# Patient Record
Sex: Female | Born: 1975
Health system: Southern US, Community
[De-identification: ages and names within clinical notes are randomized; demographics above are authoritative.]

---

## 2006-05-23 ENCOUNTER — Inpatient Hospital Stay: Payer: Self-pay

## 2009-05-19 ENCOUNTER — Emergency Department (HOSPITAL_COMMUNITY): Admission: EM | Admit: 2009-05-19 | Discharge: 2009-05-19 | Payer: Self-pay | Admitting: Emergency Medicine

## 2009-10-28 ENCOUNTER — Observation Stay: Payer: Self-pay | Admitting: Obstetrics and Gynecology

## 2009-11-26 ENCOUNTER — Inpatient Hospital Stay: Payer: Self-pay

## 2010-01-02 IMAGING — US US OB TRANSVAGINAL MODIFY
1 series · 14 of 28 positions shown · non-contrast
Comparison: none

CLINICAL DATA: Bleeding.  Last menstrual period 03/02/2009.
Estimated gestational age by last menstrual period 11 weeks 1 day.

OBSTETRIC <14 WK US AND TRANSVAGINAL OB US
TECHNIQUE: Both transabdominal and transvaginal ultrasound
examinations were performed for complete evaluation of the
gestation as well as the maternal uterus, adnexal regions, and
pelvic cul-de-sac.

[Series 1: us ob transvaginal modify · 0.25mm/px · 44 acquisitions, 14 frames shown]
[im 2/44]
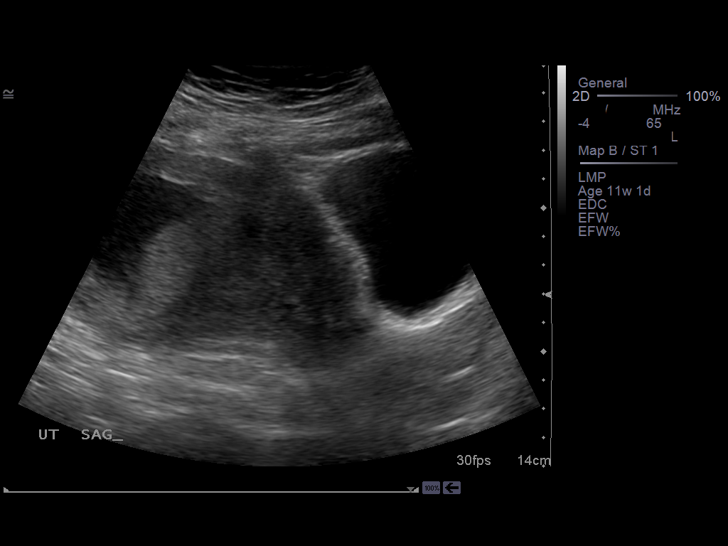
[im 5/44]
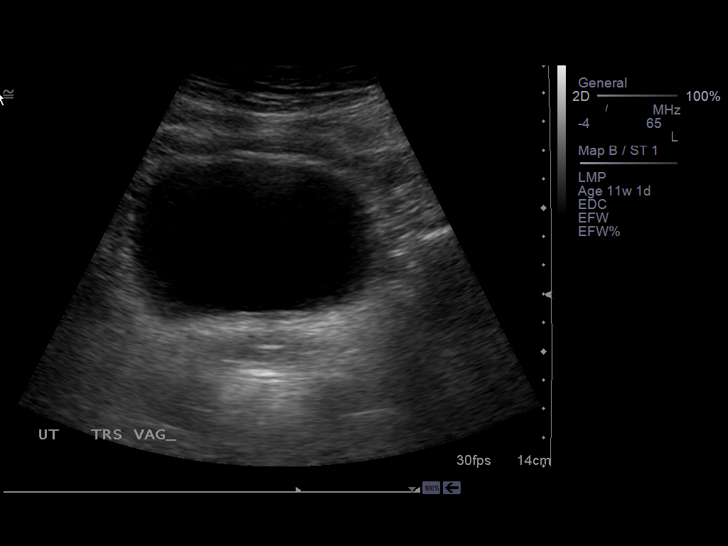
[im 8/44]
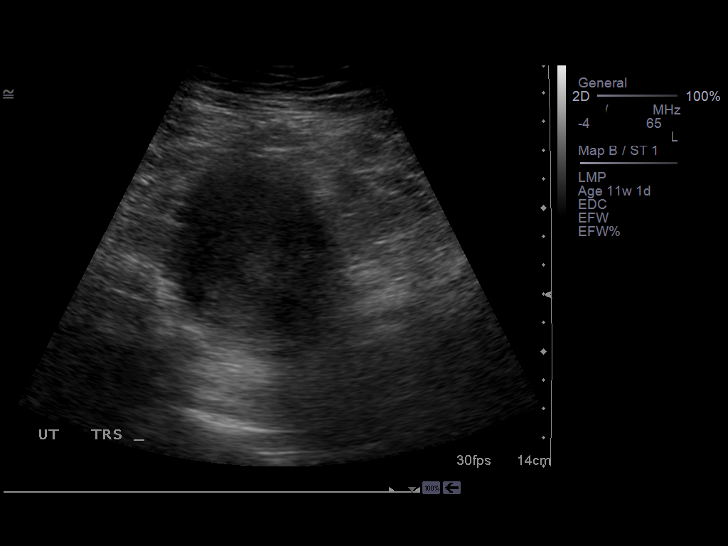
[im 12/44]
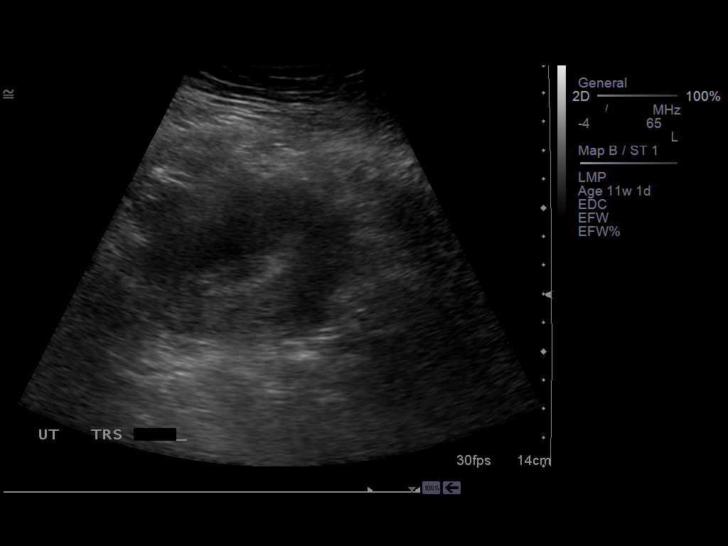
[im 15/44]
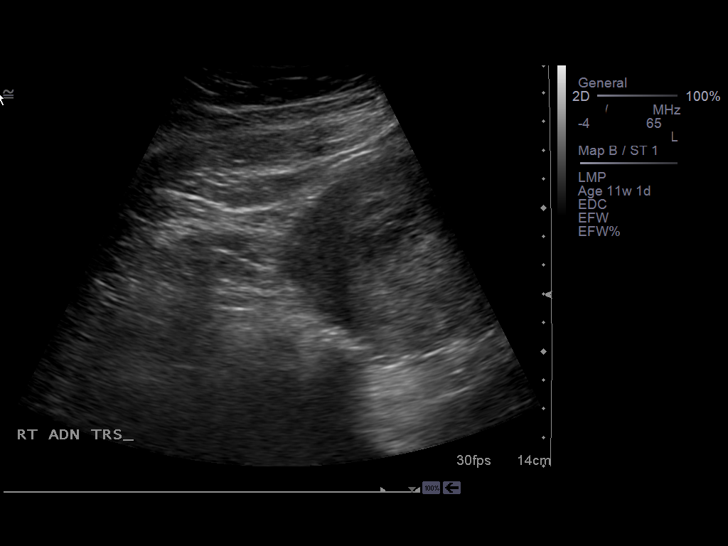
[im 18/44]
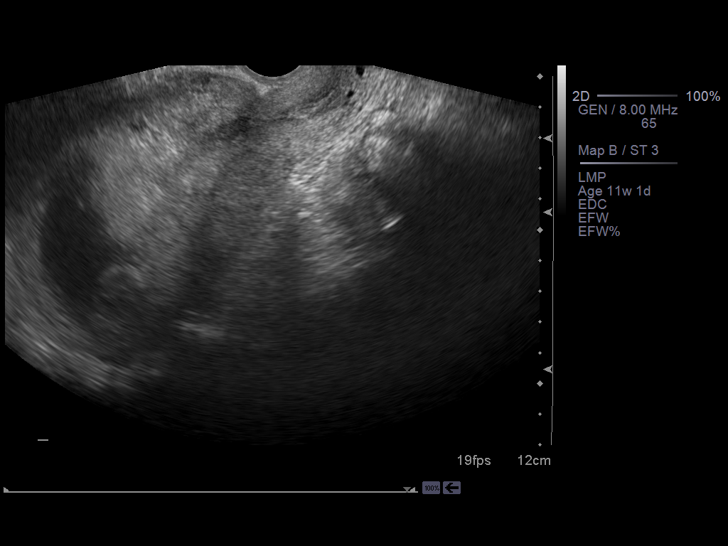
[im 21/44]
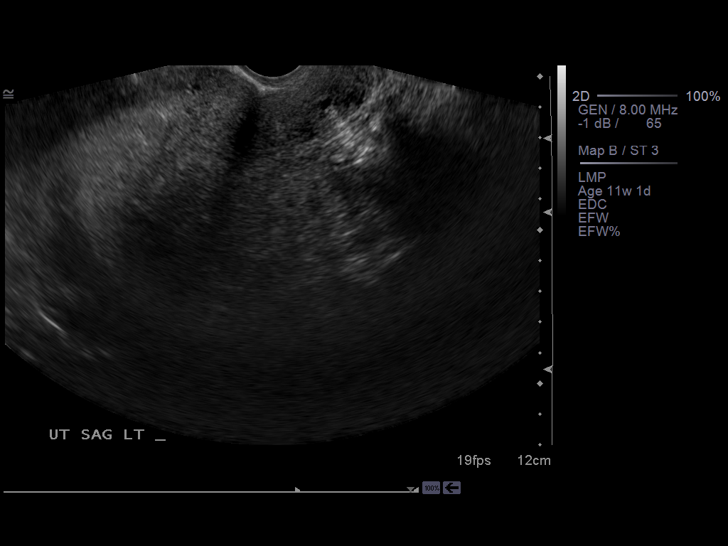
[im 24/44]
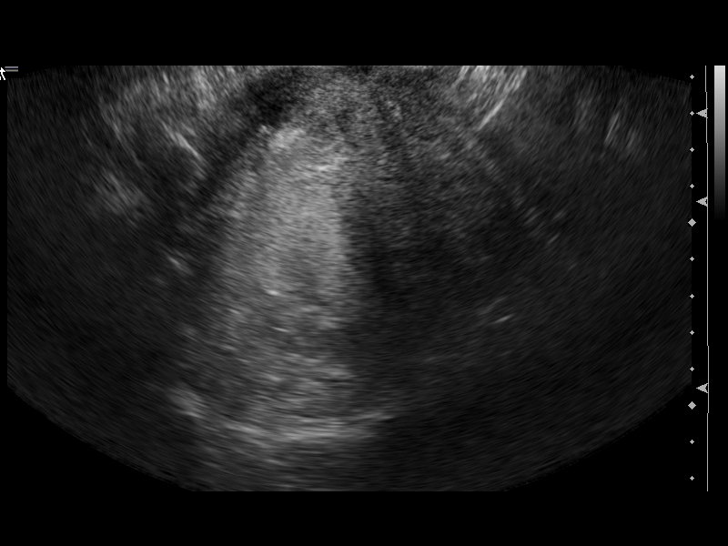
[im 28/44]
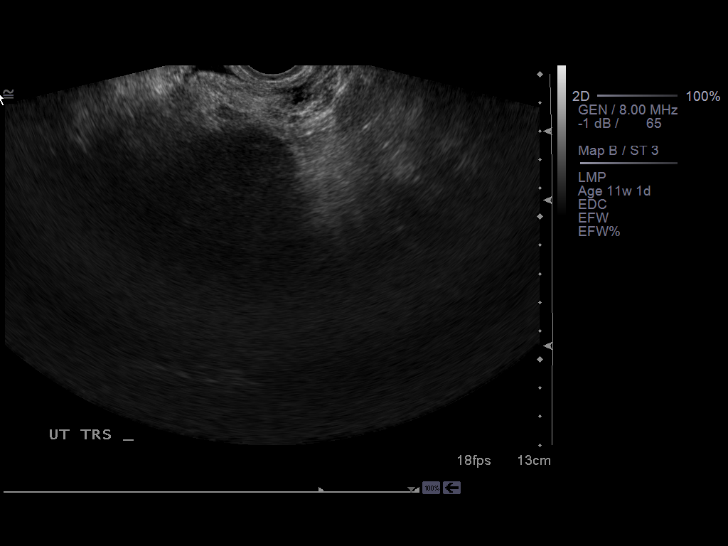
[im 31/44]
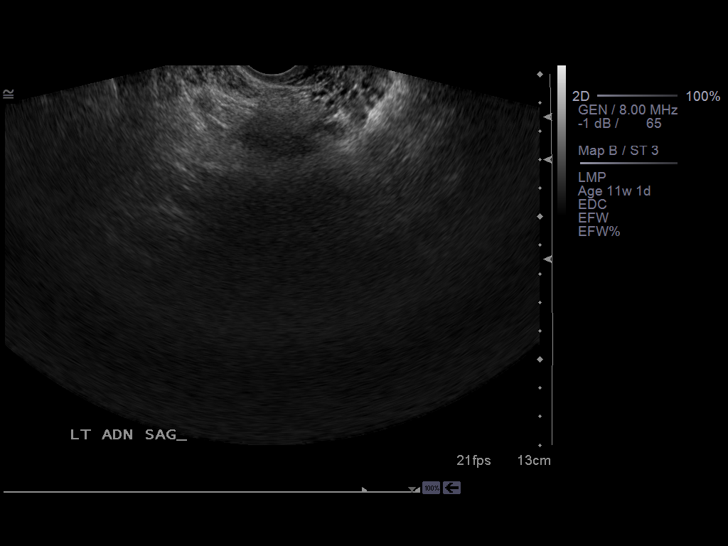
[im 34/44]
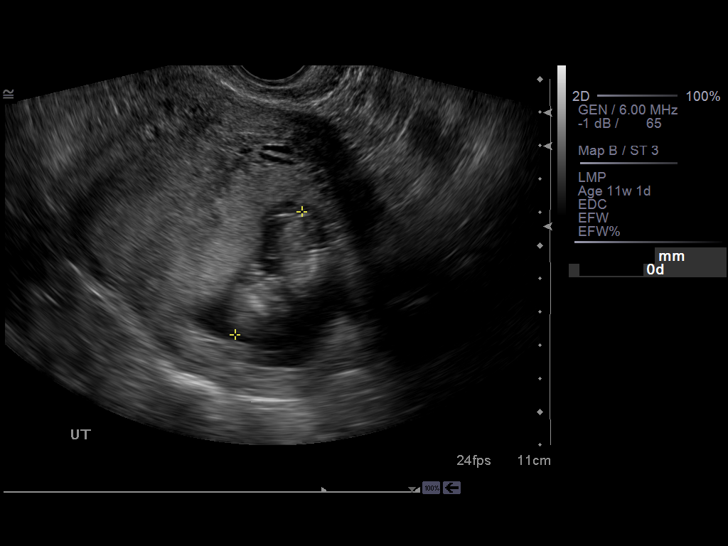
[im 37/44]
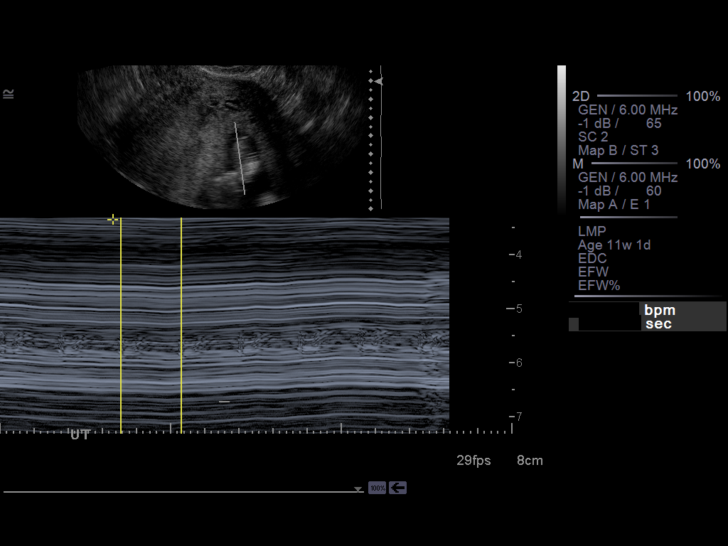
[im 40/44]
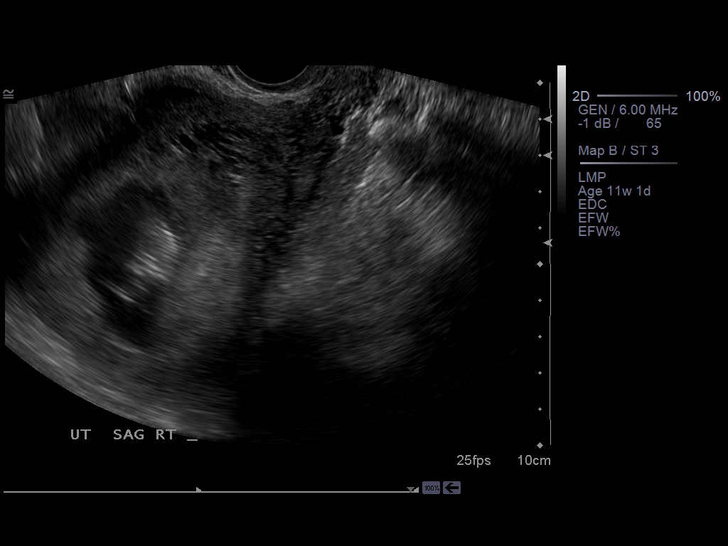
[im 44/44]
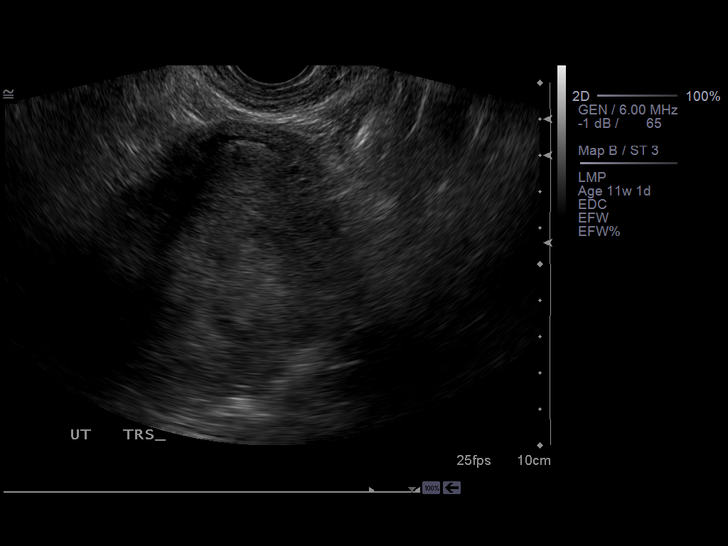

[14 of 28 positions shown; findings below may reference images not displayed]

FINDINGS: Single intrauterine gestational sac is identified.  Embryo and
cardiac activity.  Fetal heart tones at 169 beats per minute.  Mean
sac diameter is 4.2 cm, corresponding with 11 weeks 1 day.  No
subchorionic hemorrhage is identified.  Neither ovary is
visualized.  No free fluid in the anatomic pelvis.
IMPRESSION: 1.  Single viable intrauterine pregnancy.
2. Follow-up fetal 20-week anatomic ultrasound recommended, or
sooner if clinically indicated.

## 2011-02-06 LAB — WET PREP, GENITAL
Trich, Wet Prep: NONE SEEN
Yeast Wet Prep HPF POC: NONE SEEN

## 2011-02-06 LAB — URINALYSIS, ROUTINE W REFLEX MICROSCOPIC
Bilirubin Urine: NEGATIVE
Glucose, UA: NEGATIVE mg/dL
Ketones, ur: NEGATIVE mg/dL
Leukocytes, UA: NEGATIVE
pH: 7 (ref 5.0–8.0)

## 2011-02-06 LAB — GC/CHLAMYDIA PROBE AMP, GENITAL
Chlamydia, DNA Probe: NEGATIVE
GC Probe Amp, Genital: NEGATIVE

## 2011-02-06 LAB — URINE MICROSCOPIC-ADD ON

## 2011-10-30 ENCOUNTER — Emergency Department (HOSPITAL_COMMUNITY)
Admission: EM | Admit: 2011-10-30 | Discharge: 2011-10-30 | Disposition: A | Discharge status: Home / Self Care | Payer: Self-pay | Source: Home / Self Care

## 2011-10-30 DIAGNOSIS — M25561 Pain in right knee: Secondary | ICD-10-CM

## 2011-10-30 DIAGNOSIS — M25569 Pain in unspecified knee: Secondary | ICD-10-CM

## 2011-10-30 MED ORDER — IBUPROFEN 800 MG PO TABS: 800.0000 mg | ORAL_TABLET | Freq: Three times a day (TID) | ORAL | Status: AC

## 2011-10-30 NOTE — ED Notes (Signed)
rx for Ibuprofen 800 mg given to pt

## 2011-10-30 NOTE — ED Notes (Signed)
Pt c/o of knee pain which started yesterday at work, states stands a lot on concrete, states pain is lateral to knee cap , hurts worse to straighten knee out , better when bending. Denies injury.

## 2011-10-30 NOTE — ED Notes (Signed)
All questions answered and discharge instructions by Esperanza Sheets PA

## 2011-10-30 NOTE — ED Provider Notes (Signed)
Medical screening examination/treatment/procedure(s) were performed by non-physician practitioner and as supervising physician I was immediately available for consultation/collaboration.  Hillery Hunter, MD 10/30/11 [phone]

## 2011-10-30 NOTE — ED Provider Notes (Signed)
History     CSN: [id number]  Arrival date & time 10/30/11  4403   None     Chief Complaint  Patient presents with  . Knee Pain    left knee pain    (Consider location/radiation/quality/duration/timing/severity/associated sxs/prior treatment) HPI Comments: Pt c/o lateral Rt knee pain. She states pain began yesterday while at work. Was better last night, but then worse this morning. Pain worsens with weight bearing and full extension. She admits that she has had pain previously, "but not like this." And contributes her knee pain to her weight , standing and walking on concrete floors all day at work. No injury. She has taken Tylenol without relief.   The history is provided by the patient.    History reviewed. No pertinent past medical history.  Past Surgical History  Procedure Date  . Cesarean section     History reviewed. No pertinent family history.  History  Substance Use Topics  . Smoking status: Not on file  . Smokeless tobacco: Not on file  . Alcohol Use:     OB History    Grav Para Term Preterm Abortions TAB SAB Ect Mult Living                  Review of Systems  Constitutional: Negative for fever and chills.  Musculoskeletal: Negative for joint swelling.    Allergies  Review of patient's allergies indicates no known allergies.  Home Medications   Current Outpatient Rx  Name Route Sig Dispense Refill  . ACETAMINOPHEN 500 MG PO TABS Oral Take 500 mg by mouth every 6 (six) hours as needed.      . IBUPROFEN 800 MG PO TABS Oral Take 1 tablet (800 mg total) by mouth 3 (three) times daily. 15 tablet 0    BP 110/73  Pulse 83  Temp(Src) 98.2 F (36.8 C) (Oral)  Resp 16  SpO2 100%  LMP 10/21/2011  Physical Exam  Nursing note and vitals reviewed. Constitutional: She appears well-developed and well-nourished. No distress.  Cardiovascular: Normal rate, regular rhythm and normal heart sounds.   Pulmonary/Chest: Effort normal and breath sounds normal.  No respiratory distress.  Musculoskeletal: Normal range of motion.       Right knee: She exhibits normal range of motion, no swelling, no effusion, no ecchymosis, no deformity, no laceration, no erythema, normal alignment, no LCL laxity, normal patellar mobility, no bony tenderness, normal meniscus and no MCL laxity. tenderness found. Lateral joint line and LCL tenderness noted. No medial joint line, no MCL and no patellar tendon tenderness noted.       Legs: Neurological: She is alert. She has normal strength.  Reflex Scores:      Patellar reflexes are 2+ on the right side. Skin: Skin is warm and dry. No rash noted. No erythema.  Psychiatric: She has a normal mood and affect.    ED Course  Procedures (including critical care time)  Labs Reviewed - No data to display No results found.   1. Knee pain, right       MDM  Lateral joint line tenderness to palpation, no effusion, no ligament instability.         Melody Comas, [place] 10/30/11 [phone]

## 2013-03-06 ENCOUNTER — Emergency Department (INDEPENDENT_AMBULATORY_CARE_PROVIDER_SITE_OTHER)
Admission: EM | Admit: 2013-03-06 | Discharge: 2013-03-06 | Disposition: A | Discharge status: Home / Self Care | Payer: Managed Care, Other (non HMO) | Source: Home / Self Care | Attending: Emergency Medicine | Admitting: Emergency Medicine

## 2013-03-06 ENCOUNTER — Encounter (HOSPITAL_COMMUNITY): Payer: Self-pay | Admitting: *Deleted

## 2013-03-06 DIAGNOSIS — H6590 Unspecified nonsuppurative otitis media, unspecified ear: Secondary | ICD-10-CM

## 2013-03-06 DIAGNOSIS — K1370 Unspecified lesions of oral mucosa: Secondary | ICD-10-CM

## 2013-03-06 DIAGNOSIS — K1379 Other lesions of oral mucosa: Secondary | ICD-10-CM

## 2013-03-06 MED ORDER — AMOXICILLIN 500 MG PO CAPS: 500.0000 mg | ORAL_CAPSULE | Freq: Two times a day (BID) | ORAL | Status: AC

## 2013-03-06 MED ORDER — FEXOFENADINE HCL 60 MG PO TABS: 60.0000 mg | ORAL_TABLET | Freq: Two times a day (BID) | ORAL | Status: AC

## 2013-03-06 NOTE — ED Notes (Signed)
Pt  Reports  Symptoms  Of a  sorethroat  With  Swollen  Uvula        Symptoms  Started  This  Am    Symptoms  Not  releived  By  Warm  Liquids        Pt  Ambulated  To  Room with a   Steady  Fluid  Gait     Pt  Is  Not in  Any  Severe  Distress

## 2013-03-06 NOTE — ED Provider Notes (Signed)
History     CSN: [id number]  Arrival date & time 03/06/13  1001   First MD Initiated Contact with Patient 03/06/13 1022      Chief Complaint  Patient presents with  . Sore Throat    (Consider location/radiation/quality/duration/timing/severity/associated sxs/prior treatment) HPI Comments: Patient presents urgent care describing that this morning felt her throat was swollen so mild and she looked and so her uvula swollen. Experience some discomfort with swallowing. And try to use warm liquids as in salt water, gargles. No further symptoms such as cough or fevers. Has had some mild allergies in mild discomfort coming from her right ear. Nonconstant. No ear drainage, headache or dizziness.  Patient is a 37 y.o. female presenting with pharyngitis. The history is provided by the patient.  Sore Throat This is a new problem. The current episode started 12 to 24 hours ago. The problem occurs constantly. The problem has been gradually improving. Pertinent negatives include no abdominal pain, no headaches and no shortness of breath. The symptoms are aggravated by swallowing. The treatment provided no relief.    History reviewed. No pertinent past medical history.  Past Surgical History  Procedure Laterality Date  . Cesarean section      No family history on file.  History  Substance Use Topics  . Smoking status: Never Smoker   . Smokeless tobacco: Not on file  . Alcohol Use: No    OB History   Grav Para Term Preterm Abortions TAB SAB Ect Mult Living                  Review of Systems  Constitutional: Negative for fever, chills, diaphoresis, activity change, appetite change, fatigue and unexpected weight change.  HENT: Positive for ear pain and sore throat. Negative for hearing loss, nosebleeds, congestion, rhinorrhea, sneezing, mouth sores, trouble swallowing, neck pain, neck stiffness, voice change, sinus pressure and ear discharge.   Eyes: Negative for pain, itching and visual  disturbance.  Respiratory: Negative for shortness of breath.   Gastrointestinal: Negative for abdominal pain.  Musculoskeletal: Negative for myalgias, joint swelling and arthralgias.  Neurological: Negative for headaches.    Allergies  Review of patient's allergies indicates no known allergies.  Home Medications   Current Outpatient Rx  Name  Route  Sig  Dispense  Refill  . acetaminophen (TYLENOL) 500 MG tablet   Oral   Take 500 mg by mouth every 6 (six) hours as needed.           Marland Kitchen amoxicillin (AMOXIL) 500 MG capsule   Oral   Take 1 capsule (500 mg total) by mouth 2 (two) times daily.   30 capsule   0   . fexofenadine (ALLEGRA) 60 MG tablet   Oral   Take 1 tablet (60 mg total) by mouth 2 (two) times daily.   14 tablet   0     BP 110/67  Pulse 73  Temp(Src) 99 F (37.2 C) (Oral)  Resp 16  SpO2 98%  Physical Exam  Vitals reviewed. Constitutional: She appears well-developed and well-nourished.  HENT:  Head: Normocephalic. No trismus in the jaw.  Right Ear: No drainage, swelling or tenderness.  Left Ear: No drainage or swelling.  Ears:  Mouth/Throat: Uvula is midline, oropharynx is clear and moist and mucous membranes are normal. No edematous.  Eyes: Conjunctivae and EOM are normal. No scleral icterus.  Neck: Neck supple. No JVD present.  Pulmonary/Chest: Effort normal.  Lymphadenopathy:    She has no cervical adenopathy.  Neurological: She is alert.  Skin: No rash noted. No erythema.    ED Course  Procedures (including critical care time)  Labs Reviewed - No data to display No results found.   1. Uvular edema   2. Otitis, serous, right       MDM  Resolved uvulitis?- Mild otitis serosa right ear- patient encouraged to use Allegra for 7-10 days consistently. If further swelling or discomfort with swallowing or uvula become swollen again to start with provided antibiotic. Patient agrees with treatment plan followup care as necessary and  contemplative about taking this antibiotic prescription.        Jimmie Molly, MD 03/06/13 1044

## 2014-02-16 ENCOUNTER — Encounter (HOSPITAL_COMMUNITY): Payer: Self-pay | Admitting: Emergency Medicine

## 2014-02-16 ENCOUNTER — Emergency Department (HOSPITAL_COMMUNITY)
Admission: EM | Admit: 2014-02-16 | Discharge: 2014-02-16 | Disposition: A | Discharge status: Home / Self Care | Payer: Managed Care, Other (non HMO) | Source: Home / Self Care | Attending: Emergency Medicine | Admitting: Emergency Medicine

## 2014-02-16 DIAGNOSIS — J069 Acute upper respiratory infection, unspecified: Secondary | ICD-10-CM

## 2014-02-16 MED ORDER — LIDOCAINE VISCOUS 2 % MT SOLN: 20.0000 mL | OROMUCOSAL | Status: AC | PRN

## 2014-02-16 NOTE — ED Notes (Addendum)
Pt comes in with c/o nasal drip with URI sx that started last Thursday unrelieved by otc medication. Denies fever,chills,n,v Cough also noted with clear productive  Rapid strep obtained

## 2014-02-16 NOTE — ED Provider Notes (Signed)
CSN: [id number]     Arrival date & time 02/16/14  1603 History   First MD Initiated Contact with Patient 02/16/14 1651     Chief Complaint  Patient presents with  . URI  . Nasal Congestion   (Consider location/radiation/quality/duration/timing/severity/associated sxs/prior Treatment) HPI Comments: Sore throat is worst sx.   Patient is a 38 y.o. female presenting with URI. The history is provided by the patient.  URI Presenting symptoms: congestion, cough, fever, rhinorrhea and sore throat   Presenting symptoms: no ear pain and no facial pain   Severity:  Moderate Onset quality:  Gradual Duration:  4 days Timing:  Constant Progression:  Unchanged Chronicity:  New Relieved by:  Nothing Worsened by:  Nothing tried Ineffective treatments:  OTC medications (only transient relief with otc cold meds) Associated symptoms: no sinus pain     History reviewed. No pertinent past medical history. Past Surgical History  Procedure Laterality Date  . Cesarean section     History reviewed. No pertinent family history. History  Substance Use Topics  . Smoking status: Never Smoker   . Smokeless tobacco: Not on file  . Alcohol Use: No   OB History   Grav Para Term Preterm Abortions TAB SAB Ect Mult Living                 Review of Systems  Constitutional: Positive for fever and chills.  HENT: Positive for congestion, postnasal drip, rhinorrhea, sinus pressure and sore throat. Negative for ear pain.   Respiratory: Positive for cough.     Allergies  Review of patient's allergies indicates no known allergies.  Home Medications   Prior to Admission medications   Medication Sig Start Date End Date Taking? Authorizing Provider  acetaminophen (TYLENOL) 500 MG tablet Take 500 mg by mouth every 6 (six) hours as needed.      Historical Provider, MD  fexofenadine (ALLEGRA) 60 MG tablet Take 1 tablet (60 mg total) by mouth 2 (two) times daily. 03/06/13 03/13/13  Jimmie MollyPaolo Coll, MD  lidocaine  (XYLOCAINE) 2 % solution Use as directed 20 mLs in the mouth or throat as needed for mouth pain. 02/16/14   Cathlyn ParsonsAngela M Anhar Mcdermott, NP   BP 119/72  Pulse 115  Temp(Src) 99 F (37.2 C) (Oral)  Resp 21  SpO2 100% Physical Exam  Constitutional: She appears well-developed and well-nourished. She appears ill. No distress.  HENT:  Right Ear: Tympanic membrane, external ear and ear canal normal.  Left Ear: Tympanic membrane, external ear and ear canal normal.  Nose: Mucosal edema and rhinorrhea present. Right sinus exhibits no maxillary sinus tenderness and no frontal sinus tenderness. Left sinus exhibits no maxillary sinus tenderness and no frontal sinus tenderness.  Mouth/Throat: Mucous membranes are normal. Posterior oropharyngeal erythema present. No oropharyngeal exudate, posterior oropharyngeal edema or tonsillar abscesses.  Cardiovascular: Tachycardia present.   Pulmonary/Chest: Effort normal and breath sounds normal.  Lymphadenopathy:       Head (right side): No submental, no submandibular and no tonsillar adenopathy present.       Head (left side): No submental, no submandibular and no tonsillar adenopathy present.    She has no cervical adenopathy.    ED Course  Procedures (including critical care time) Labs Review Labs Reviewed - No data to display  Results for orders placed during the hospital encounter of 05/19/09  GC/CHLAMYDIA PROBE AMP, GENITAL      Result Value Ref Range   GC Probe Amp, Genital    NEGATIVE   Value:  NEGATIVE     (NOTE)  Testing performed using the BD Probetec ET Chlamydia trachomatis and Neisseria gonorrhea amplified DNA assay.   Chlamydia, DNA Probe    NEGATIVE   Value: NEGATIVE     (NOTE)  Testing performed using the BD Probetec ET Chlamydia trachomatis and Neisseria gonorrhea amplified DNA assay.  WET PREP, GENITAL      Result Value Ref Range   Yeast Wet Prep HPF POC NONE SEEN  NONE SEEN   Trich, Wet Prep NONE SEEN  NONE SEEN   Clue Cells Wet Prep HPF POC  MODERATE (*) NONE SEEN   WBC, Wet Prep HPF POC MODERATE (*) NONE SEEN  URINALYSIS, ROUTINE W REFLEX MICROSCOPIC      Result Value Ref Range   Color, Urine YELLOW  YELLOW   APPearance CLEAR  CLEAR   Specific Gravity, Urine 1.014  1.005 - 1.030   pH 7.0  5.0 - 8.0   Glucose, UA NEGATIVE  NEGATIVE mg/dL   Hgb urine dipstick TRACE (*) NEGATIVE   Bilirubin Urine NEGATIVE  NEGATIVE   Ketones, ur NEGATIVE  NEGATIVE mg/dL   Protein, ur NEGATIVE  NEGATIVE mg/dL   Urobilinogen, UA 1.0  0.0 - 1.0 mg/dL   Nitrite NEGATIVE  NEGATIVE   Leukocytes, UA NEGATIVE  NEGATIVE  URINE MICROSCOPIC-ADD ON      Result Value Ref Range   WBC, UA 0-2  <3 WBC/hpf   RBC / HPF 0-2  <3 RBC/hpf   Bacteria, UA RARE  RARE  HCG, QUANTITATIVE, PREGNANCY      Result Value Ref Range   hCG, Beta Chain, Quant, S   (*) <5 mIU/mL   Value: [id number]              GEST. AGE      CONC.  (mIU/mL)       <=1 WEEK        5 - 50         2 WEEKS       50 - 500         3 WEEKS       100 - 10,000         4 WEEKS     1,000 - 30,000         5 WEEKS     3,500 - 115,000       6-8 WEEKS     12,000 - 270,000        12 WEEKS     15,000 - 220,000                FEMALE AND NON-PREGNANT FEMALE:         LESS THAN 5 mIU/mL  POCT PREGNANCY, URINE      Result Value Ref Range   Preg Test, Ur       Value: POSITIVE            THE SENSITIVITY OF THIS     METHODOLOGY IS >24 mIU/mL   Imaging Review No results found.   MDM   1. URI (upper respiratory infection)   pt reports she is probably a little dehydrated as she has not drunk sufficient fluids today; this is likely cause plus URI of tachycardia. Pt to increase po fluids at home. Rx lidocaine 2% mucosal solution, 20mL prn throat pain #16400mL. Discussed other supportive measures.      Cathlyn ParsonsAngela M Zaeden Lastinger, NP 02/16/14 [phone]

## 2014-02-16 NOTE — Discharge Instructions (Signed)
Continue using cold medicine as directed on the package. Consider using throat sprays or lozenges. Try herbal tea (like peppermint) with fresh lemon squeezed into it to relieve your sore throat. If these measures don't work, you can get the lidocaine prescription filled. The medicine is to be swallowed to work best.    Upper Respiratory Infection, Adult An upper respiratory infection (URI) is also sometimes known as the common cold. The upper respiratory tract includes the nose, sinuses, throat, trachea, and bronchi. Bronchi are the airways leading to the lungs. Most people improve within 1 week, but symptoms can last up to 2 weeks. A residual cough may last even longer.  CAUSES Many different viruses can infect the tissues lining the upper respiratory tract. The tissues become irritated and inflamed and often become very moist. Mucus production is also common. A cold is contagious. You can easily spread the virus to others by oral contact. This includes kissing, sharing a glass, coughing, or sneezing. Touching your mouth or nose and then touching a surface, which is then touched by another person, can also spread the virus. SYMPTOMS  Symptoms typically develop 1 to 3 days after you come in contact with a cold virus. Symptoms vary from person to person. They may include:  Runny nose.  Sneezing.  Nasal congestion.  Sinus irritation.  Sore throat.  Loss of voice (laryngitis).  Cough.  Fatigue.  Muscle aches.  Loss of appetite.  Headache.  Low-grade fever. DIAGNOSIS  You might diagnose your own cold based on familiar symptoms, since most people get a cold 2 to 3 times a year. Your caregiver can confirm this based on your exam. Most importantly, your caregiver can check that your symptoms are not due to another disease such as strep throat, sinusitis, pneumonia, asthma, or epiglottitis. Blood tests, throat tests, and X-rays are not necessary to diagnose a common cold, but they may  sometimes be helpful in excluding other more serious diseases. Your caregiver will decide if any further tests are required. RISKS AND COMPLICATIONS  You may be at risk for a more severe case of the common cold if you smoke cigarettes, have chronic heart disease (such as heart failure) or lung disease (such as asthma), or if you have a weakened immune system. The very young and very old are also at risk for more serious infections. Bacterial sinusitis, middle ear infections, and bacterial pneumonia can complicate the common cold. The common cold can worsen asthma and chronic obstructive pulmonary disease (COPD). Sometimes, these complications can require emergency medical care and may be life-threatening. PREVENTION  The best way to protect against getting a cold is to practice good hygiene. Avoid oral or hand contact with people with cold symptoms. Wash your hands often if contact occurs. There is no clear evidence that vitamin C, vitamin E, echinacea, or exercise reduces the chance of developing a cold. However, it is always recommended to get plenty of rest and practice good nutrition. TREATMENT  Treatment is directed at relieving symptoms. There is no cure. Antibiotics are not effective, because the infection is caused by a virus, not by bacteria. Treatment may include:  Increased fluid intake. Sports drinks offer valuable electrolytes, sugars, and fluids.  Breathing heated mist or steam (vaporizer or shower).  Eating chicken soup or other clear broths, and maintaining good nutrition.  Getting plenty of rest.  Using gargles or lozenges for comfort.  Controlling fevers with ibuprofen or acetaminophen as directed by your caregiver.  Increasing usage of your inhaler  if you have asthma. Zinc gel and zinc lozenges, taken in the first 24 hours of the common cold, can shorten the duration and lessen the severity of symptoms. Pain medicines may help with fever, muscle aches, and throat pain. A  variety of non-prescription medicines are available to treat congestion and runny nose. Your caregiver can make recommendations and may suggest nasal or lung inhalers for other symptoms.  HOME CARE INSTRUCTIONS   Only take over-the-counter or prescription medicines for pain, discomfort, or fever as directed by your caregiver.  Use a warm mist humidifier or inhale steam from a shower to increase air moisture. This may keep secretions moist and make it easier to breathe.  Drink enough water and fluids to keep your urine clear or pale yellow.  Rest as needed.  Return to work when your temperature has returned to normal or as your caregiver advises. You may need to stay home longer to avoid infecting others. You can also use a face mask and careful hand washing to prevent spread of the virus. SEEK MEDICAL CARE IF:   After the first few days, you feel you are getting worse rather than better.  You need your caregiver's advice about medicines to control symptoms.  You develop chills, worsening shortness of breath, or brown or red sputum. These may be signs of pneumonia.  You develop yellow or brown nasal discharge or pain in the face, especially when you bend forward. These may be signs of sinusitis.  You develop a fever, swollen neck glands, pain with swallowing, or white areas in the back of your throat. These may be signs of strep throat. SEEK IMMEDIATE MEDICAL CARE IF:   You have a fever.  You develop severe or persistent headache, ear pain, sinus pain, or chest pain.  You develop wheezing, a prolonged cough, cough up blood, or have a change in your usual mucus (if you have chronic lung disease).  You develop sore muscles or a stiff neck. Document Released: 04/12/2001 Document Revised: 01/09/2012 Document Reviewed: 02/18/2011 [facility] Patient Information 2014 [employer] [place], [place].

## 2014-02-17 NOTE — ED Provider Notes (Signed)
Medical screening examination/treatment/procedure(s) were performed by non-physician practitioner and as supervising physician I was immediately available for consultation/collaboration.  Leslee Homeavid Nels Munn, M.D.  Reuben Likesavid C Charnetta Wulff, MD 02/17/14 [phone]

## 2014-09-23 ENCOUNTER — Other Ambulatory Visit: Payer: Self-pay | Admitting: Obstetrics & Gynecology

## 2014-09-23 ENCOUNTER — Other Ambulatory Visit (HOSPITAL_COMMUNITY)
Admission: RE | Admit: 2014-09-23 | Discharge: 2014-09-23 | Disposition: A | Discharge status: Home / Self Care | Payer: Managed Care, Other (non HMO) | Source: Ambulatory Visit | Attending: Obstetrics & Gynecology | Admitting: Obstetrics & Gynecology

## 2014-09-23 DIAGNOSIS — Z01419 Encounter for gynecological examination (general) (routine) without abnormal findings: Secondary | ICD-10-CM | POA: Insufficient documentation

## 2014-09-23 DIAGNOSIS — Z1151 Encounter for screening for human papillomavirus (HPV): Secondary | ICD-10-CM | POA: Diagnosis present

## 2014-09-26 LAB — CYTOLOGY - PAP

## 2017-12-26 ENCOUNTER — Other Ambulatory Visit: Payer: Self-pay | Admitting: Obstetrics & Gynecology

## 2017-12-26 DIAGNOSIS — Z139 Encounter for screening, unspecified: Secondary | ICD-10-CM

## 2017-12-29 ENCOUNTER — Ambulatory Visit: Payer: Managed Care, Other (non HMO)

## 2018-01-17 ENCOUNTER — Ambulatory Visit: Payer: Managed Care, Other (non HMO)

## 2018-01-29 ENCOUNTER — Ambulatory Visit: Payer: Managed Care, Other (non HMO)
